# Patient Record
Sex: Male | Born: 1990 | Hispanic: No | Marital: Single | Smoking: Current every day smoker
Health system: Southern US, Community
[De-identification: ages and names within clinical notes are randomized; demographics above are authoritative.]

## PROBLEM LIST (undated history)

## (undated) ENCOUNTER — Emergency Department (HOSPITAL_COMMUNITY): Admission: EM | Discharge status: Absent without leave | Payer: Managed Care, Other (non HMO)

---

## 2011-09-08 ENCOUNTER — Emergency Department (HOSPITAL_COMMUNITY)
Admission: EM | Admit: 2011-09-08 | Discharge: 2011-09-08 | Disposition: A | Discharge status: Home / Self Care | Payer: Managed Care, Other (non HMO) | Attending: Emergency Medicine | Admitting: Emergency Medicine

## 2011-09-08 ENCOUNTER — Encounter (HOSPITAL_COMMUNITY): Payer: Self-pay | Admitting: Emergency Medicine

## 2011-09-08 DIAGNOSIS — R05 Cough: Secondary | ICD-10-CM | POA: Insufficient documentation

## 2011-09-08 DIAGNOSIS — J45909 Unspecified asthma, uncomplicated: Secondary | ICD-10-CM | POA: Insufficient documentation

## 2011-09-08 DIAGNOSIS — R059 Cough, unspecified: Secondary | ICD-10-CM | POA: Insufficient documentation

## 2011-09-08 DIAGNOSIS — F17200 Nicotine dependence, unspecified, uncomplicated: Secondary | ICD-10-CM | POA: Insufficient documentation

## 2011-09-08 DIAGNOSIS — J069 Acute upper respiratory infection, unspecified: Secondary | ICD-10-CM

## 2011-09-08 MED ORDER — BENZONATATE 100 MG PO CAPS: 100.0000 mg | ORAL_CAPSULE | Freq: Three times a day (TID) | ORAL | Status: AC

## 2011-09-08 NOTE — ED Provider Notes (Signed)
History     CSN: [id number]  Arrival date & time 09/08/11  1623   First MD Initiated Contact with Patient 09/08/11 1828      Chief Complaint  Patient presents with  . URI    (Consider location/radiation/quality/duration/timing/severity/associated sxs/prior treatment) HPI History from patient. 21 year old male who presents with cough, congestion, headache, and dizziness. Symptoms started yesterday, but were worse this morning upon awakening. Cough has been nonproductive in nature. Headache is described as generalized in nature and has not changed since onset. No radiation, no known aggravating/alleviating factors. He also describes a sensation of unsteadiness which was worse when he first awoke this morning. No medication taken at home prior to arrival. No known sick contacts. Denies neck pain, visual changes, photophobia, nausea, vomiting. No change in appetite. No known fever/chills.  Past Medical History  Diagnosis Date  . Asthma     History reviewed. No pertinent past surgical history.  No family history on file.  History  Substance Use Topics  . Smoking status: Current Everyday Smoker  . Smokeless tobacco: Not on file  . Alcohol Use:       Review of Systems as per history of present illness  Allergies  Review of patient's allergies indicates no known allergies.  Home Medications  No current outpatient prescriptions on file.  BP 125/57  Pulse 87  Temp 98.3 F (36.8 C)  Resp 18  SpO2 95%  Physical Exam  Nursing note and vitals reviewed. Constitutional: He appears well-developed and well-nourished. No distress.  HENT:  Head: Normocephalic and atraumatic.  Right Ear: External ear normal.  Left Ear: External ear normal.  Mouth/Throat: Oropharyngeal exudate present.       TMs normal bilaterally. No sinus tenderness to palpation or percussion.  Eyes: Conjunctivae and EOM are normal. Pupils are equal, round, and reactive to light. Right eye exhibits no  discharge. Left eye exhibits no discharge.  Neck: Normal range of motion. Neck supple. Normal range of motion present.  Cardiovascular: Normal rate, regular rhythm and normal heart sounds.   Pulmonary/Chest: Effort normal and breath sounds normal. He exhibits no tenderness.  Abdominal: There is no tenderness.  Musculoskeletal: Normal range of motion.  Lymphadenopathy:    He has no cervical adenopathy.  Neurological: He is alert.  Skin: Skin is warm and dry. He is not diaphoretic.  Psychiatric: He has a normal mood and affect.    ED Course  Procedures (including critical care time)  Labs Reviewed - No data to display No results found.   1. URI (upper respiratory infection)       MDM  This 21 year old male presents with URI like symptoms for the past 2 days. Vital signs stable. He is nontoxic appearing, and has a reassuring exam. Likely URI. Instructed on symptomatic treatment. Reasons to return to ED discussed.         Grant Fontana, [place] 09/08/11 1902

## 2011-09-08 NOTE — Discharge Instructions (Signed)
You appear to have a viral upper respiratory infection at this time. Please take the Tessalon as needed for cough. You can buy Mucinex or Mucinex D over the counter for congestion. You can use ibuprofen (600 mg every 6 hours) for body aches.  Consider using a neti pot, saline nose spray for your chronic congestion which may be coming from allergies. You can also try Zyrtec or Claritin.  Return to the ED if you have a high fever not controlled by medication, neck pain, worsening headache, or any other worrisome symptoms.  RESOURCE GUIDE  Dental Problems  Patients with Medicaid: [facility] [phone]                                           [phone] [employer] Phone:  [phone]                                                  Phone:  [phone]  If unable to pay or uninsured, contact:  [facility] or [facility]. to become qualified for the adult dental clinic.  Chronic Pain Problems Contact [facility]  [phone] Patients need to be referred by their primary care doctor.  Insufficient Money for Medicine Contact [employer]:  call "211" or [facility] 760-248-4147.  No Primary Care Doctor Call [facility]  [phone] Other agencies that provide inexpensive medical care    [facility]  [phone]    [facility]  [phone]    [facility]  [phone]    [facility]  [phone]    [facility]  (580)[phone]    [facility]  [phone]  [facility] Health  [phone] [facility]  [phone] [facility] Mental Health   [phone] (emergency services [phone])  Substance Abuse Resources [facility]  602-184-3201 [facility] [phone] [facility] [phone] Floydene Flock [phone] Residential & Outpatient Substance Abuse Program   [phone]  Abuse/Neglect [facility] Child Abuse Hotline 435-662-2732 [facility] Child Abuse Hotline 667-457-9742 (After Hours)  Emergency Shelter [facility] 732-532-9681  [facility] [place] [facility] (517)171-3813 [facility] (516)[phone]  MRSA Hotline #:   (878)528-0298    [facility] Resources  [facility]     [employer]                          [facility] [address] [place]                       [address]      [address]  Patrecia Pace  Michell Heinrich Phone:  [phone]                                   Phone:  [phone]                 Phone:  [phone]  [facility] Mental Health Phone:  ([phone])[phone]  [facility] Child Abuse Hotline 575-027-9732 (715) [phone] (After Hours)  Upper Respiratory Infection, Adult An upper respiratory infection (URI) is also sometimes known as the common cold. The upper respiratory tract includes the nose, sinuses, throat, trachea, and bronchi. Bronchi are the airways leading to the lungs. Most people improve within 1 week, but symptoms can last up to 2 weeks. A residual cough may last even longer.  CAUSES Many different viruses can infect the tissues lining the upper respiratory tract. The tissues become irritated and inflamed and often become very moist. Mucus production is also common. A cold is contagious. You can easily spread the virus to others by oral contact. This includes kissing, sharing a glass, coughing, or sneezing. Touching your mouth or nose and then touching a surface, which is then touched by another person, can also spread the virus. SYMPTOMS  Symptoms typically develop 1 to 3 days after you come in contact with a cold virus. Symptoms vary from person to person. They may include:  Runny nose.   Sneezing.   Nasal  congestion.   Sinus irritation.   Sore throat.   Loss of voice (laryngitis).   Cough.   Fatigue.   Muscle aches.   Loss of appetite.   Headache.   Low-grade fever.  DIAGNOSIS  You might diagnose your own cold based on familiar symptoms, since most people get a cold 2 to 3 times a year. Your caregiver can confirm this based on your exam. Most importantly, your caregiver can check that your symptoms are not due to another disease such as strep throat, sinusitis, pneumonia, asthma, or epiglottitis. Blood tests, throat tests, and X-rays are not necessary to diagnose a common cold, but they may sometimes be helpful in excluding other more serious diseases. Your caregiver will decide if any further tests are required. RISKS AND COMPLICATIONS  You may be at risk for a more severe case of the common cold if you smoke cigarettes, have chronic heart disease (such as heart failure) or lung disease (such as asthma), or if you have a weakened immune system. The very young and very old are also at risk for more serious infections. Bacterial sinusitis, middle ear infections, and bacterial pneumonia can complicate the common cold. The common cold can worsen asthma and chronic obstructive pulmonary disease (COPD). Sometimes, these complications can require emergency medical care and may be life-threatening. PREVENTION  The best way to protect against getting a cold is to practice good hygiene. Avoid oral or hand contact with people with cold symptoms. Wash your hands often if contact occurs. There is no clear evidence that vitamin C, vitamin E, echinacea, or exercise reduces the chance of developing a cold. However, it is always recommended to get plenty of rest and practice good nutrition. TREATMENT  Treatment is directed at relieving symptoms. There is no cure. Antibiotics are not effective, because the infection is caused by a virus, not by bacteria. Treatment may include:  Increased fluid intake.  Sports drinks offer valuable electrolytes, sugars, and fluids.   Breathing heated mist or steam (vaporizer or shower).  Eating chicken soup or other clear broths, and maintaining good nutrition.   Getting plenty of rest.   Using gargles or lozenges for comfort.   Controlling fevers with ibuprofen or acetaminophen as directed by your caregiver.   Increasing usage of your inhaler if you have asthma.  Zinc gel and zinc lozenges, taken in the first 24 hours of the common cold, can shorten the duration and lessen the severity of symptoms. Pain medicines may help with fever, muscle aches, and throat pain. A variety of non-prescription medicines are available to treat congestion and runny nose. Your caregiver can make recommendations and may suggest nasal or lung inhalers for other symptoms.  HOME CARE INSTRUCTIONS   Only take over-the-counter or prescription medicines for pain, discomfort, or fever as directed by your caregiver.   Use a warm mist humidifier or inhale steam from a shower to increase air moisture. This may keep secretions moist and make it easier to breathe.   Drink enough water and fluids to keep your urine clear or pale yellow.   Rest as needed.   Return to work when your temperature has returned to normal or as your caregiver advises. You may need to stay home longer to avoid infecting others. You can also use a face mask and careful hand washing to prevent spread of the virus.  SEEK MEDICAL CARE IF:   After the first few days, you feel you are getting worse rather than better.   You need your caregiver's advice about medicines to control symptoms.   You develop chills, worsening shortness of breath, or brown or red sputum. These may be signs of pneumonia.   You develop yellow or brown nasal discharge or pain in the face, especially when you bend forward. These may be signs of sinusitis.   You develop a fever, swollen neck glands, pain with swallowing, or white areas  in the back of your throat. These may be signs of strep throat.  SEEK IMMEDIATE MEDICAL CARE IF:   You have a fever.   You develop severe or persistent headache, ear pain, sinus pain, or chest pain.   You develop wheezing, a prolonged cough, cough up blood, or have a change in your usual mucus (if you have chronic lung disease).   You develop sore muscles or a stiff neck.  Document Released: 10/01/2000 Document Revised: 03/27/2011 Document Reviewed: 08/09/2010 [facility] Patient Information 2012 [place], [place].

## 2011-09-09 NOTE — ED Provider Notes (Signed)
Medical screening examination/treatment/procedure(s) were performed by non-physician practitioner and as supervising physician I was immediately available for consultation/collaboration.   Kaula Klenke, MD 09/09/11 0005 

## 2012-02-16 ENCOUNTER — Emergency Department (HOSPITAL_COMMUNITY)
Admission: EM | Admit: 2012-02-16 | Discharge: 2012-02-16 | Disposition: A | Discharge status: Home / Self Care | Payer: Managed Care, Other (non HMO) | Attending: Emergency Medicine | Admitting: Emergency Medicine

## 2012-02-16 ENCOUNTER — Encounter (HOSPITAL_COMMUNITY): Payer: Self-pay | Admitting: Vascular Surgery

## 2012-02-16 DIAGNOSIS — J45909 Unspecified asthma, uncomplicated: Secondary | ICD-10-CM | POA: Insufficient documentation

## 2012-02-16 DIAGNOSIS — L42 Pityriasis rosea: Secondary | ICD-10-CM | POA: Insufficient documentation

## 2012-02-16 DIAGNOSIS — F17200 Nicotine dependence, unspecified, uncomplicated: Secondary | ICD-10-CM | POA: Insufficient documentation

## 2012-02-16 NOTE — ED Provider Notes (Signed)
History  This chart was scribed for [name], MD by Shari Heritage and Marlin Canary. The patient was seen in room TR06C/TR06C. Patient's care was started at 2247.    CSN: [id number]  Arrival date & time 02/16/12  2247   None     Chief Complaint  Patient presents with  . Rash     Patient is a 21 y.o. male presenting with rash. The history is provided by the patient. No language interpreter was used.  Rash  This is a recurrent problem. The current episode started more than 1 week ago. The problem has not changed since onset.There has been no fever. The rash is present on the torso and back. The patient is experiencing no pain. Treatments tried: antibiotics. The treatment provided no relief.    HPI comments:  Luis Farmer is a 21 y.o. male who presents to the Emergency Department complaining of constant, moderate generalized rash to his back onset 2-3 months ago. Patient says he saw an MD in [place] for a rash a few months ago and they thought the rash was due to bacteria. He was prescribed medication but he did not take the full course. Patient also complains of joint paint in his knees and elbows that began 1 month ago. He denies joint swelling, fever, erythema and chills. Patient has a history of asthma. He is an every day smoker.        Associated joint pain Due to bacteria from the water   Past Medical History  Diagnosis Date  . Asthma     History reviewed. No pertinent past surgical history.  History reviewed. No pertinent family history.  History  Substance Use Topics  . Smoking status: Current Every Day Smoker -- 1.0 packs/day    Types: Cigarettes  . Smokeless tobacco: Not on file  . Alcohol Use: Yes     occasionally       Review of Systems  Skin: Positive for rash.  All other systems reviewed and are negative.    Allergies  Review of patient's allergies indicates no known allergies.  Home Medications  No current outpatient prescriptions  on file.  BP 141/79  Pulse 104  Temp 98 F (36.7 C) (Oral)  Resp 18  SpO2 98%  Physical Exam  Nursing note and vitals reviewed. Constitutional: He appears well-developed and well-nourished. No distress.  HENT:  Head: Normocephalic and atraumatic.  Eyes: Conjunctivae normal are normal. Right eye exhibits no discharge. Left eye exhibits no discharge.  Neck: Neck supple.  Cardiovascular: Normal rate, regular rhythm and normal heart sounds.  Exam reveals no gallop and no friction rub.   No murmur heard. Pulmonary/Chest: Effort normal and breath sounds normal. No respiratory distress.  Abdominal: Soft. He exhibits no distension. There is no tenderness.  Musculoskeletal: Normal range of motion. He exhibits no edema and no tenderness.  Neurological: He is alert.  Skin: Skin is warm and dry. Rash noted.       Across back and upper chest there is an ovoid well circumscribed rash. Some lesions have slightly raised borders. Mild scaling in central aspects. Non tender, no drainage.   Psychiatric: He has a normal mood and affect. His behavior is normal. Thought content normal.    ED Course  Procedures (including critical care time) DIAGNOSTIC STUDIES: Oxygen Saturation is 98% on room air. Normal  by my interpretation.    COORDINATION OF CARE: 11:25pm-Patient informed of current plan for treatment and evaluation and agrees with plan at this time.  Labs Reviewed - No data to display No results found.   1. Pityriasis rosea       MDM  21yM with rash. Suspect pityriasis. Expectant management. Not consistent with infectious process. Return precautions discussed.    I personally preformed the services scribed in my presence. The recorded information has been reviewed and considered. [name], MD.        [name], MD 02/18/12 [phone]

## 2012-02-16 NOTE — ED Notes (Signed)
Pt reports to the ED for eval of rash located on his abdomen, right shoulder, and upper back since this summer. Denies any pain or itching. States that he saw an MD in [facility] where he was at the time the rash appeared. He received medication for it but stopped taking it because he reports he doesn't like taking medicine. Also states that his joints feel like they are popping a lot.

## 2012-08-24 ENCOUNTER — Emergency Department (HOSPITAL_COMMUNITY)
Admission: EM | Admit: 2012-08-24 | Discharge: 2012-08-24 | Discharge status: Against Medical Advice | Payer: Managed Care, Other (non HMO) | Attending: Emergency Medicine | Admitting: Emergency Medicine

## 2012-08-24 ENCOUNTER — Emergency Department (HOSPITAL_COMMUNITY): Payer: Managed Care, Other (non HMO)

## 2012-08-24 ENCOUNTER — Encounter (HOSPITAL_COMMUNITY): Payer: Self-pay

## 2012-08-24 DIAGNOSIS — R059 Cough, unspecified: Secondary | ICD-10-CM | POA: Insufficient documentation

## 2012-08-24 DIAGNOSIS — R05 Cough: Secondary | ICD-10-CM | POA: Insufficient documentation

## 2012-08-24 NOTE — ED Notes (Signed)
Pt not in room, unable to locate  

## 2012-08-24 NOTE — ED Notes (Signed)
Unable to locate patient x30 min

## 2012-08-24 NOTE — ED Notes (Signed)
Bed:WA06<BR> Expected date:<BR> Expected time:<BR> Means of arrival:<BR> Comments:<BR>

## 2012-08-24 NOTE — ED Notes (Signed)
Pt states he has been coughing for the past 3 weeks.  Pt states he has been coughing up green mucous.

## 2013-07-19 ENCOUNTER — Emergency Department (HOSPITAL_COMMUNITY)
Admission: EM | Admit: 2013-07-19 | Discharge: 2013-07-19 | Disposition: A | Discharge status: Home / Self Care | Payer: Managed Care, Other (non HMO) | Attending: Emergency Medicine | Admitting: Emergency Medicine

## 2013-07-19 ENCOUNTER — Encounter (HOSPITAL_COMMUNITY): Payer: Self-pay | Admitting: Emergency Medicine

## 2013-07-19 DIAGNOSIS — F17200 Nicotine dependence, unspecified, uncomplicated: Secondary | ICD-10-CM | POA: Insufficient documentation

## 2013-07-19 DIAGNOSIS — J45901 Unspecified asthma with (acute) exacerbation: Secondary | ICD-10-CM | POA: Insufficient documentation

## 2013-07-19 MED ORDER — PREDNISONE 20 MG PO TABS: 10.0000 mg | ORAL_TABLET | Freq: Two times a day (BID) | ORAL | Status: DC

## 2013-07-19 MED ORDER — ALBUTEROL SULFATE (2.5 MG/3ML) 0.083% IN NEBU
5.0000 mg | INHALATION_SOLUTION | Freq: Once | RESPIRATORY_TRACT | Status: AC
  Administered 2013-07-19: 5 mg via RESPIRATORY_TRACT
  Filled 2013-07-19: qty 6

## 2013-07-19 MED ORDER — PREDNISONE 20 MG PO TABS
60.0000 mg | ORAL_TABLET | Freq: Once | ORAL | Status: AC
  Administered 2013-07-19: 60 mg via ORAL
  Filled 2013-07-19: qty 3

## 2013-07-19 MED ORDER — ALBUTEROL SULFATE HFA 108 (90 BASE) MCG/ACT IN AERS: 1.0000 | INHALATION_SPRAY | Freq: Four times a day (QID) | RESPIRATORY_TRACT | Status: DC | PRN

## 2013-07-19 MED ORDER — IPRATROPIUM BROMIDE 0.02 % IN SOLN
0.5000 mg | Freq: Once | RESPIRATORY_TRACT | Status: AC
  Administered 2013-07-19: 0.5 mg via RESPIRATORY_TRACT
  Filled 2013-07-19: qty 2.5

## 2013-07-19 NOTE — ED Notes (Signed)
Pt reports a non productive cough that started 2 days ago and that he feels like something is in his chest. Pt has a congested cough in triage but able to communicate in full sentences. Pt denies any fevers or runny nose. Pt denies talking any medications to help treat symptoms or being around any one sick. Pt report coming back from out of town and the weather was cold. Pt alert and ambulatory.

## 2013-07-19 NOTE — ED Provider Notes (Signed)
Medical screening examination/treatment/procedure(s) were performed by non-physician practitioner and as supervising physician I was immediately available for consultation/collaboration.  Meigan Pates L Katasha Riga, MD 07/19/13 2312 

## 2013-07-19 NOTE — ED Provider Notes (Signed)
CSN: [id number]     Arrival date & time 07/19/13  2030 History  This chart was scribed for non-physician practitioner, Kyung BaccaKatie Jaylyn Iyer, PA-C,working with Flint MelterElliott L Wentz, MD, by Karle PlumberJennifer Tensley, ED Scribe.  This patient was seen in room WTR8/WTR8 and the patient's care was started at 10:15 PM.  Chief Complaint  Patient presents with  . Cough   The history is provided by the patient. No language interpreter was used.   HPI Comments:  Isidor HoltsMajed Audia is a 23 y.o. male with h/o asthma, who presents to the Emergency Department complaining of a nonproductive cough that started about four days ago. He reports traveling to [place] and the weather was really cold there. He reports associated nasal congestion and sneezing. He denies any sick contacts. He denies SOB, trouble breathing, nausea, fever, or abdominal pain. He states his last asthma exacerbation was "a long time ago". He denies allergies to any medications.   Past Medical History  Diagnosis Date  . Asthma    History reviewed. No pertinent past surgical history. History reviewed. No pertinent family history. History  Substance Use Topics  . Smoking status: Current Every Day Smoker -- 1.00 packs/day    Types: Cigarettes  . Smokeless tobacco: Not on file  . Alcohol Use: Yes     Comment: occasionally     Review of Systems  Constitutional: Negative for fever.  Respiratory: Positive for cough.   All other systems reviewed and are negative.    Allergies  Review of patient's allergies indicates no known allergies.  Home Medications  No current outpatient prescriptions on file. Triage Vitals: BP 131/64  Pulse 106  Temp(Src) 98.4 F (36.9 C) (Oral)  Resp 20  SpO2 96% Physical Exam  Nursing note and vitals reviewed. Constitutional: He is oriented to person, place, and time. He appears well-developed and well-nourished.  HENT:  Head: Normocephalic and atraumatic.  Eyes: Conjunctivae are normal.  Neck: Normal range of  motion.  Cardiovascular: Normal rate.   Pulmonary/Chest: Effort normal. He exhibits no tenderness.  Coughing.  Mild, diffuse expiratory wheezing.    Musculoskeletal: Normal range of motion.  Neurological: He is alert and oriented to person, place, and time.  Skin: Skin is warm and dry.  Psychiatric: He has a normal mood and affect. His behavior is normal.    ED Course  Procedures (including critical care time) DIAGNOSTIC STUDIES: Oxygen Saturation is 96% on RA, adequate by my interpretation.   COORDINATION OF CARE: 10:19 PM- Will order a nebulizer treatment. Pt verbalizes understanding and agrees to plan.  Medications - No data to display  Labs Review Labs Reviewed - No data to display Imaging Review No results found.   EKG Interpretation None      MDM   Final diagnoses:  Asthma exacerbation    23yo M w/ h/o asthma presents w/ cough, wheezing, sensation of something being stuck in center of chest.  Asthma exacerbations infrequent and he is not sure if that is what he is experiencing.  Attributes sx to change in climate and being exposed to strong cologne on trip to [place] this past weekend.  On exam, afebrile, VS w/in nml range, no respiratory distress, diffuse expiratory wheezing, coughing.  Suspect asthma attack.  Pt to receive albuterol/atrovent neb and prednisone.  Will reassess shortly.  10:36 PM   Pt reports feeling better and wheezing resolved on re-examination.  Prescribed albuterol inhaler and 5d course of prednisone.  Provided him w/ phone number for [facility].  Return precautions  discussed.   I personally performed the services described in this documentation, which was scribed in my presence. The recorded information has been reviewed and is accurate.    Otilio Miu, PA-C 07/19/13 2308

## 2013-07-19 NOTE — ED Notes (Signed)
Pt left without getting his discharge instructions.

## 2013-07-19 NOTE — Discharge Instructions (Signed)
Use albuterol inhaler, 2 puffs every 4 hours, as needed for cough and shortness of breath.  Take prednisone as prescribed.   Return to the ER if your symptoms worsen.  Asthma, Acute Bronchospasm Acute bronchospasm caused by asthma is also referred to as an asthma attack. Bronchospasm means your air passages become narrowed. The narrowing is caused by inflammation and tightening of the muscles in the air tubes (bronchi) in your lungs. This can make it hard to breath or cause you to wheeze and cough. CAUSES Possible triggers are:  Animal dander from the skin, hair, or feathers of animals.  Dust mites contained in house dust.  Cockroaches.  Pollen from trees or grass.  Mold.  Cigarette or tobacco smoke.  Air pollutants such as dust, household cleaners, hair sprays, aerosol sprays, paint fumes, strong chemicals, or strong odors.  Cold air or weather changes. Cold air may trigger inflammation. Winds increase molds and pollens in the air.  Strong emotions such as crying or laughing hard.  Stress.  Certain medicines such as aspirin or beta-blockers.  Sulfites in foods and drinks, such as dried fruits and wine.  Infections or inflammatory conditions, such as a flu, cold, or inflammation of the nasal membranes (rhinitis).  Gastroesophageal reflux disease (GERD). GERD is a condition where stomach acid backs up into your throat (esophagus).  Exercise or strenuous activity. SIGNS AND SYMPTOMS   Wheezing.  Excessive coughing, particularly at night.  Chest tightness.  Shortness of breath. DIAGNOSIS  Your health care provider will ask you about your medical history and perform a physical exam. A chest X-ray or blood testing may be performed to look for other causes of your symptoms or other conditions that may have triggered your asthma attack. TREATMENT  Treatment is aimed at reducing inflammation and opening up the airways in your lungs. Most asthma attacks are treated with  inhaled medicines. These include quick relief or rescue medicines (such as bronchodilators) and controller medicines (such as inhaled corticosteroids). These medicines are sometimes given through an inhaler or a nebulizer. Systemic steroid medicine taken by mouth or given through an IV tube also can be used to reduce the inflammation when an attack is moderate or severe. Antibiotic medicines are only used if a bacterial infection is present.  HOME CARE INSTRUCTIONS   Rest.  Drink plenty of liquids. This helps the mucus to remain thin and be easily coughed up. Only use caffeine in moderation and do not use alcohol until you have recovered from your illness.  Do not smoke. Avoid being exposed to secondhand smoke.  You play a critical role in keeping yourself in good health. Avoid exposure to things that cause you to wheeze or to have breathing problems.  Keep your medicines up to date and available. Carefully follow your health care provider's treatment plan.  Take your medicine exactly as prescribed.  When pollen or pollution is bad, keep windows closed and use an air conditioner or go to places with air conditioning.  Asthma requires careful medical care. See your health care provider for a follow-up as advised. If you are more than [redacted] weeks pregnant and you were prescribed any new medicines, let your obstetrician know about the visit and how you are doing. Follow-up with your health care provider as directed.  After you have recovered from your asthma attack, make an appointment with your outpatient doctor to talk about ways to reduce the likelihood of future attacks. If you do not have a doctor who manages your  asthma, make an appointment with a primary care doctor to discuss your asthma. SEEK IMMEDIATE MEDICAL CARE IF:   You are getting worse.  You have trouble breathing. If severe, call your local emergency services (911 in the [place]  You develop chest pain or discomfort.  You are  vomiting.  You are not able to keep fluids down.  You are coughing up yellow, green, brown, or bloody sputum.  You have a fever and your symptoms suddenly get worse.  You have trouble swallowing. MAKE SURE YOU:   Understand these instructions.  Will watch your condition.  Will get help right away if you are not doing well or get worse. Document Released: 07/23/2006 Document Revised: 12/08/2012 Document Reviewed: 10/13/2012 [facility] Patient Information 2014 [place], [place].

## 2014-08-24 IMAGING — CR DG CHEST 2V
2 series · 2 of 2 positions shown · non-contrast
Comparison: None.

CLINICAL DATA: Nonproductive cough

CHEST - 2 VIEW

[w chest pa]
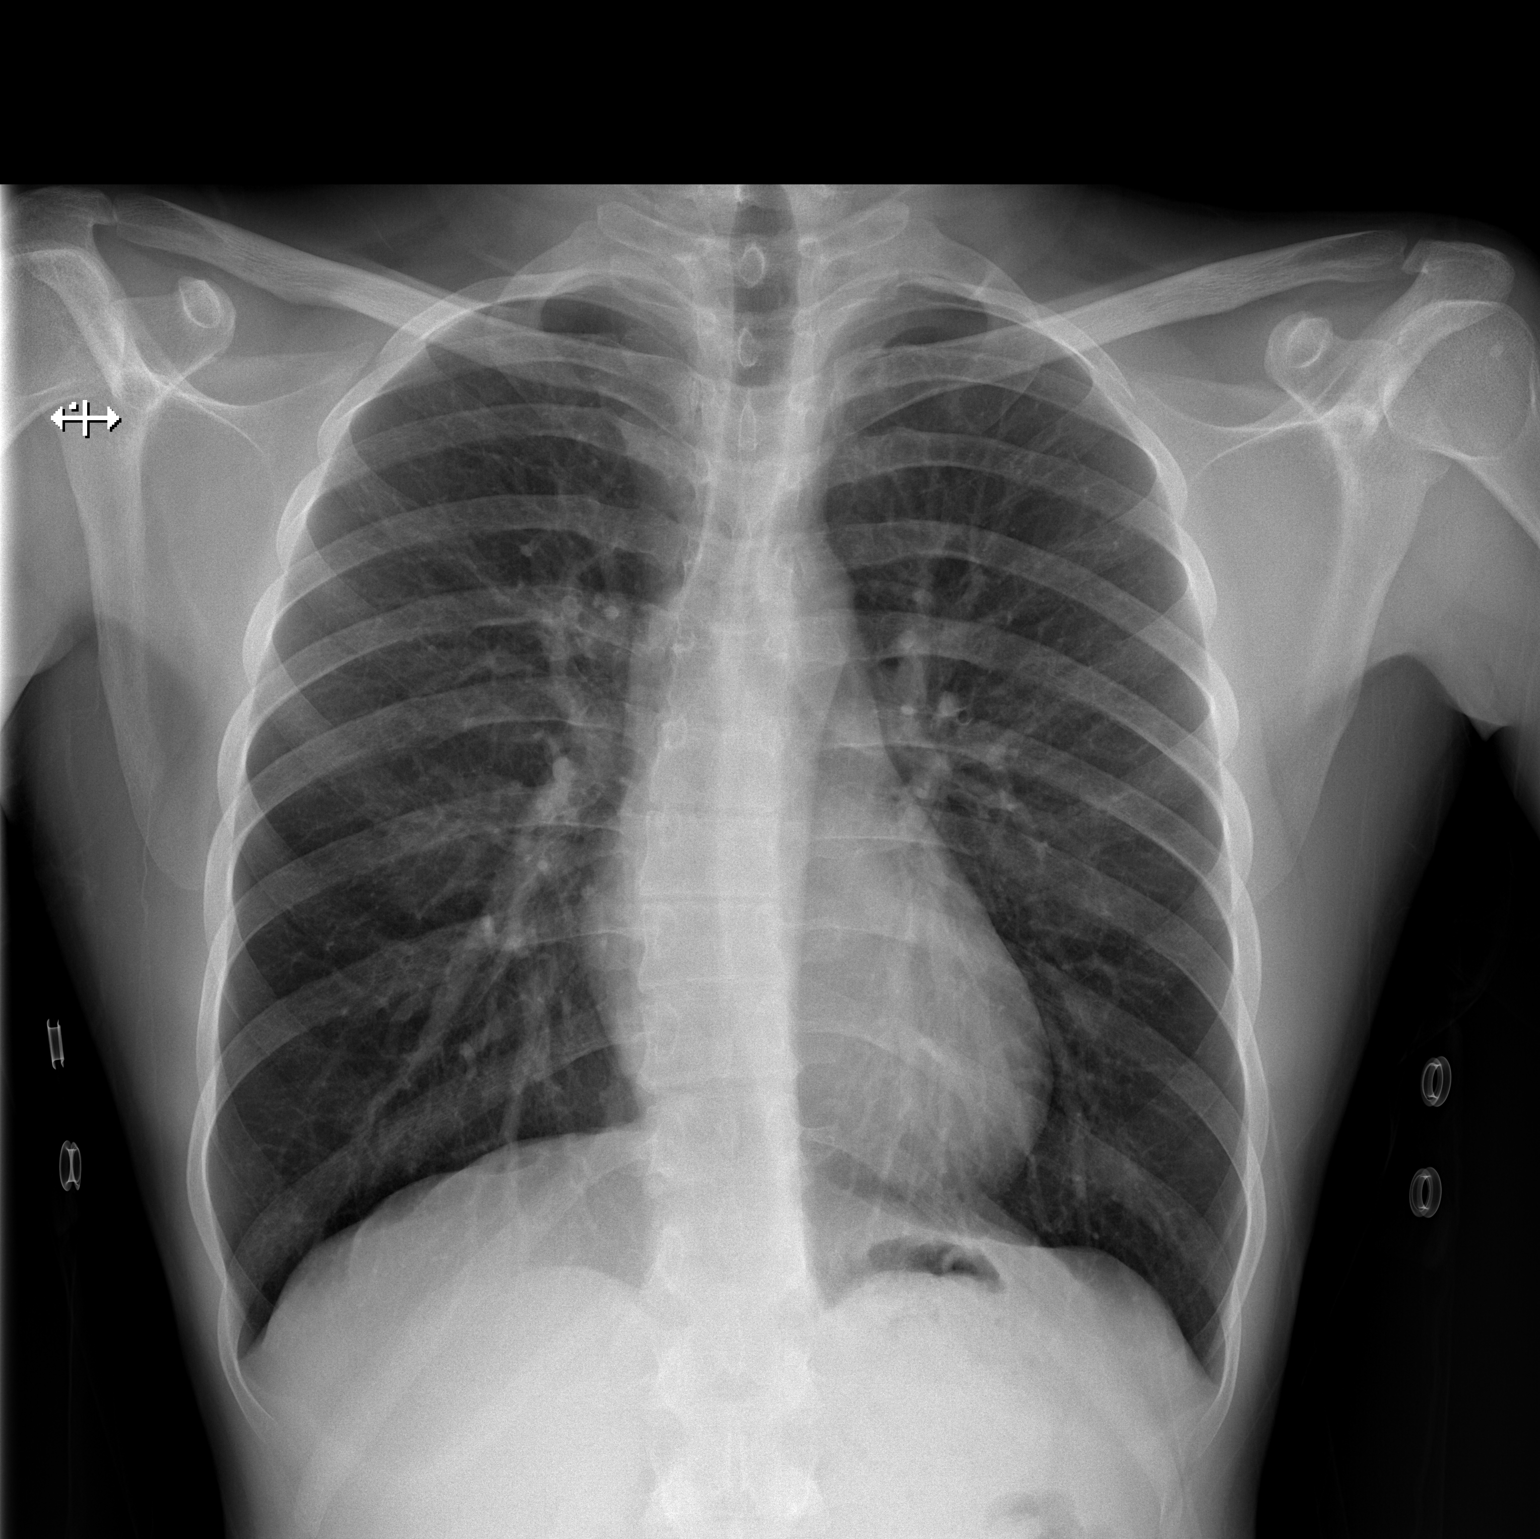

[w chest lat]
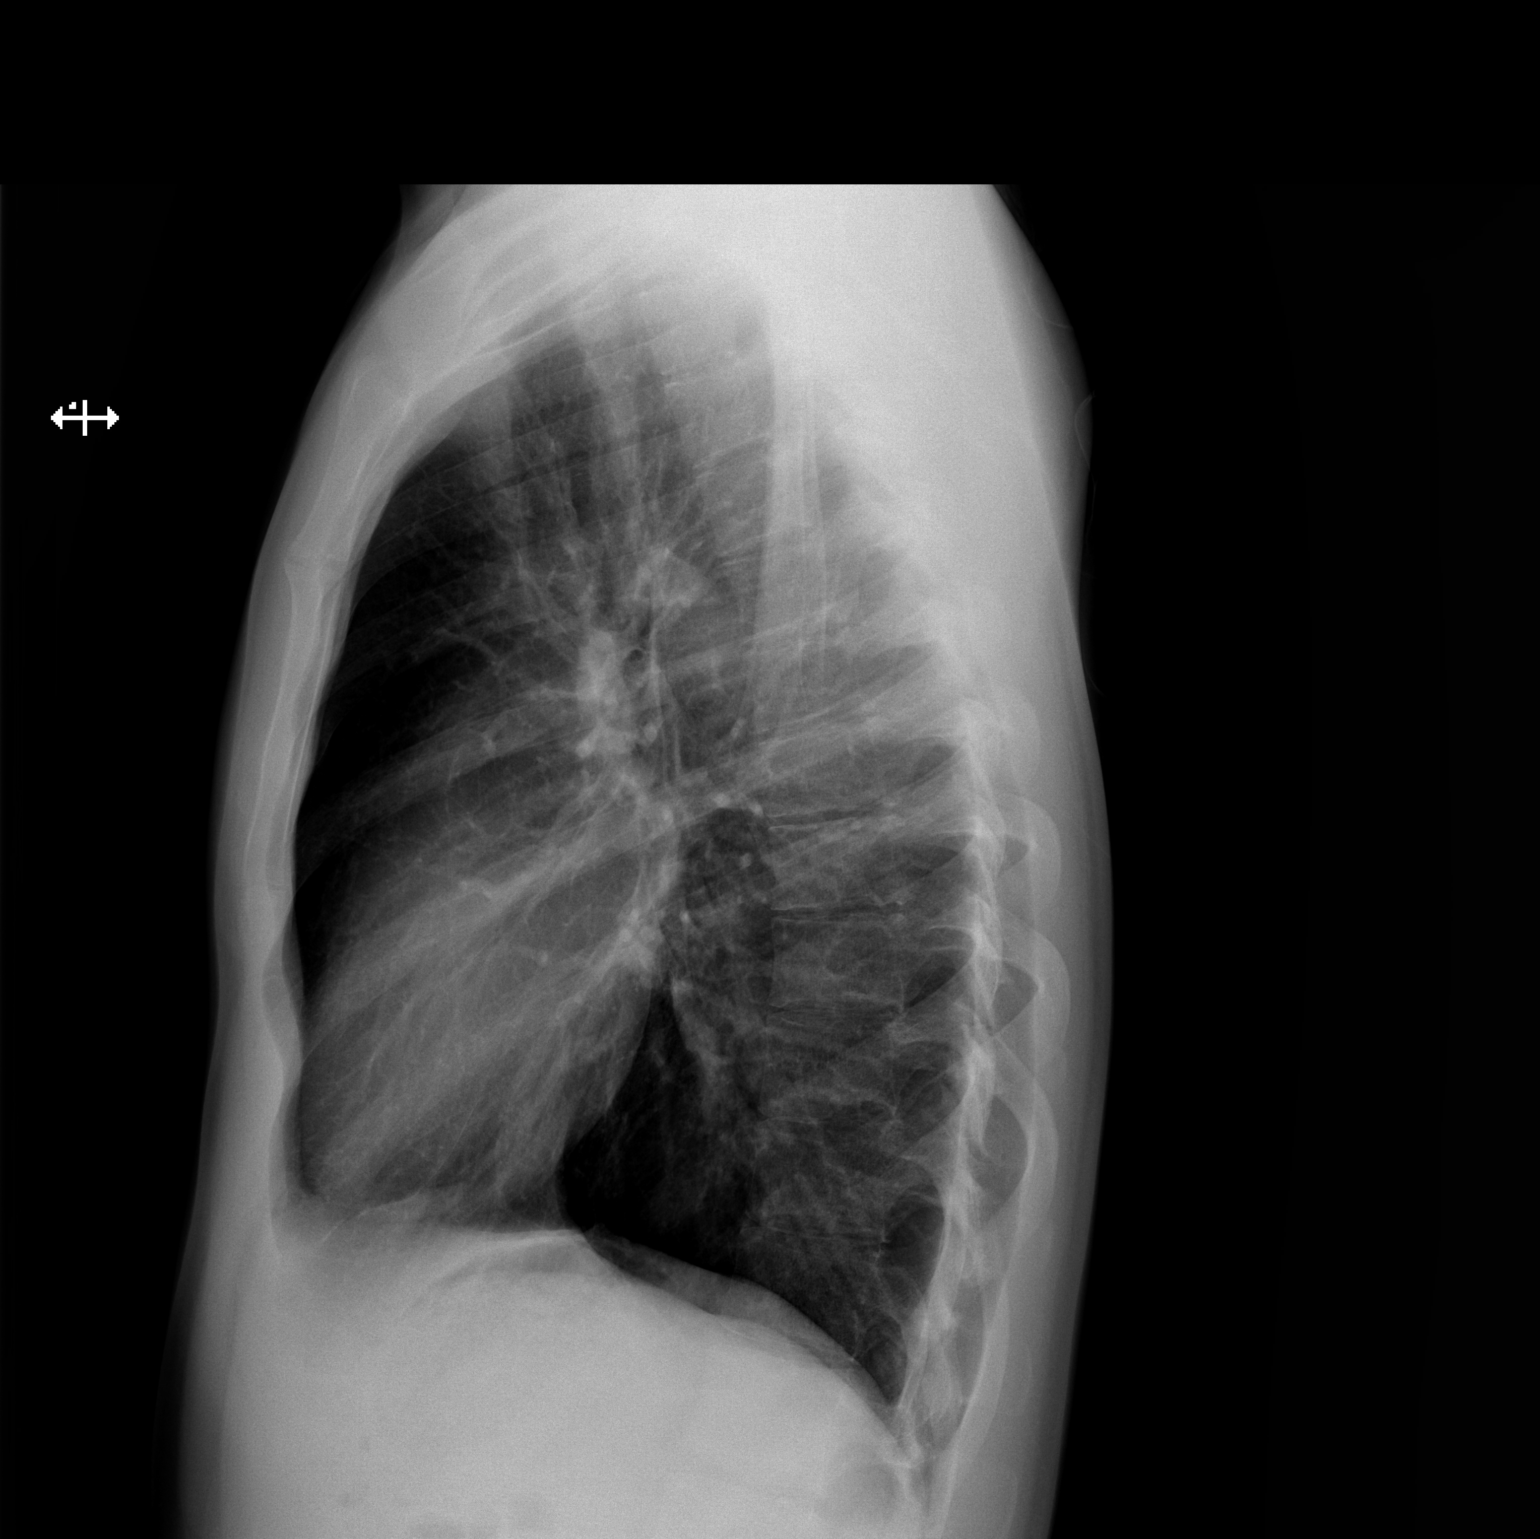

[2 of 2 positions shown; findings below may reference images not displayed]

FINDINGS: Normal mediastinum and cardiac silhouette.  Normal
pulmonary  vasculature.  No evidence of effusion, infiltrate, or
pneumothorax.  No acute bony abnormality.
IMPRESSION: No acute cardiopulmonary process.

## 2015-02-28 ENCOUNTER — Emergency Department (HOSPITAL_COMMUNITY)
Admission: EM | Admit: 2015-02-28 | Discharge: 2015-02-28 | Disposition: A | Discharge status: Home / Self Care | Payer: Managed Care, Other (non HMO) | Attending: Emergency Medicine | Admitting: Emergency Medicine

## 2015-02-28 ENCOUNTER — Encounter (HOSPITAL_COMMUNITY): Payer: Self-pay | Admitting: Emergency Medicine

## 2015-02-28 DIAGNOSIS — R1013 Epigastric pain: Secondary | ICD-10-CM

## 2015-02-28 DIAGNOSIS — R112 Nausea with vomiting, unspecified: Secondary | ICD-10-CM

## 2015-02-28 DIAGNOSIS — J45909 Unspecified asthma, uncomplicated: Secondary | ICD-10-CM | POA: Insufficient documentation

## 2015-02-28 DIAGNOSIS — Z720 Tobacco use: Secondary | ICD-10-CM | POA: Insufficient documentation

## 2015-02-28 LAB — COMPREHENSIVE METABOLIC PANEL
ALBUMIN: 5 g/dL (ref 3.5–5.0)
ALT: 21 U/L (ref 17–63)
AST: 22 U/L (ref 15–41)
Alkaline Phosphatase: 59 U/L (ref 38–126)
Anion gap: 8 (ref 5–15)
BUN: 20 mg/dL (ref 6–20)
CHLORIDE: 105 mmol/L (ref 101–111)
CO2: 27 mmol/L (ref 22–32)
Calcium: 9.7 mg/dL (ref 8.9–10.3)
Creatinine, Ser: 0.8 mg/dL (ref 0.61–1.24)
GFR calc Af Amer: >60 mL/min (ref >60)
GFR calc non Af Amer: >60 mL/min (ref >60)
GLUCOSE: 108 mg/dL — AB (ref 65–99)
POTASSIUM: 4.5 mmol/L (ref 3.5–5.1)
Sodium: 140 mmol/L (ref 135–145)
Total Bilirubin: 1 mg/dL (ref 0.3–1.2)
Total Protein: 7.8 g/dL (ref 6.5–8.1)

## 2015-02-28 LAB — URINALYSIS, ROUTINE W REFLEX MICROSCOPIC
BILIRUBIN URINE: NEGATIVE
GLUCOSE, UA: NEGATIVE mg/dL
HGB URINE DIPSTICK: NEGATIVE
Ketones, ur: 40 mg/dL — AB
Leukocytes, UA: NEGATIVE
Nitrite: NEGATIVE
PH: 7 (ref 5.0–8.0)
Protein, ur: NEGATIVE mg/dL
SPECIFIC GRAVITY, URINE: 1.03 (ref 1.005–1.030)
Urobilinogen, UA: 0.2 mg/dL (ref 0.0–1.0)

## 2015-02-28 LAB — CBC
HEMATOCRIT: 45.6 % (ref 39.0–52.0)
Hemoglobin: 16.5 g/dL (ref 13.0–17.0)
MCH: 31.3 pg (ref 26.0–34.0)
MCHC: 36.2 g/dL — AB (ref 30.0–36.0)
MCV: 86.4 fL (ref 78.0–100.0)
Platelets: 235 10*3/uL (ref 150–400)
RBC: 5.28 MIL/uL (ref 4.22–5.81)
RDW: 12.3 % (ref 11.5–15.5)
WBC: 9.3 10*3/uL (ref 4.0–10.5)

## 2015-02-28 LAB — LIPASE, BLOOD: LIPASE: 30 U/L (ref 11–51)

## 2015-02-28 MED ORDER — ONDANSETRON HCL 4 MG PO TABS: 4.0000 mg | ORAL_TABLET | Freq: Three times a day (TID) | ORAL | Status: AC | PRN

## 2015-02-28 NOTE — ED Notes (Signed)
Pt states that he started having abdominal pain last night during the election with N/V. Denies diarrhea. Alert and oriented.

## 2015-02-28 NOTE — ED Provider Notes (Signed)
CSN: [id number]     Arrival date & time 02/28/15  1712 History   First MD Initiated Contact with Patient 02/28/15 2039     Chief Complaint  Patient presents with  . Abdominal Pain     (Consider location/radiation/quality/duration/timing/severity/associated sxs/prior Treatment) HPI   Pt presents with upper abdominal pain, N/V that began this morning around 5am, last episode was approximately 1-2 hours prior to coming to ED.  States the epigastric pain felt like stretching or pulling of the upper abdomen and would last about 2 minutes, was followed by vomiting which caused the pain to resolve.  Emesis was contents of stomach and then saliva.  Had normal bowel movement at 4pm.  Pt was awake all night watching the election returns, was drinking a lot of coffee.  Denies drinking ETOH or any abnormal foods.  Denies fevers, CP, SOB.  He is currently feeling much better and is actually very hungry.    Past Medical History  Diagnosis Date  . Asthma    History reviewed. No pertinent past surgical history. History reviewed. No pertinent family history. Social History  Substance Use Topics  . Smoking status: Current Every Day Smoker -- 1.00 packs/day    Types: Cigarettes  . Smokeless tobacco: None  . Alcohol Use: Yes     Comment: occasionally     Review of Systems  All other systems reviewed and are negative.     Allergies  Review of patient's allergies indicates no known allergies.  Home Medications   Prior to Admission medications   Medication Sig Start Date End Date Taking? Authorizing Provider  ondansetron (ZOFRAN) 4 MG tablet Take 1 tablet (4 mg total) by mouth every 8 (eight) hours as needed for nausea or vomiting. 02/28/15   Trixie Dredge, PA-C   BP 131/47 mmHg  Pulse 89  Temp(Src) 97.5 F (36.4 C) (Oral)  Resp 18  SpO2 100% Physical Exam  Constitutional: He appears well-developed and well-nourished. No distress.  HENT:  Head: Normocephalic and atraumatic.  Neck: Neck  supple.  Cardiovascular: Normal rate and regular rhythm.   Pulmonary/Chest: Effort normal and breath sounds normal. No respiratory distress. He has no wheezes. He has no rales.  Abdominal: Soft. He exhibits no distension and no mass. There is no tenderness. There is no rebound and no guarding.  Neurological: He is alert. He exhibits normal muscle tone.  Skin: He is not diaphoretic.  Nursing note and vitals reviewed.   ED Course  Procedures (including critical care time) Labs Review Labs Reviewed  COMPREHENSIVE METABOLIC PANEL - Abnormal; Notable for the following:    Glucose, Bld 108 (*)    All other components within normal limits  CBC - Abnormal; Notable for the following:    MCHC 36.2 (*)    All other components within normal limits  URINALYSIS, ROUTINE W REFLEX MICROSCOPIC (NOT AT [facility]) - Abnormal; Notable for the following:    Ketones, ur 40 (*)    All other components within normal limits  LIPASE, BLOOD    Imaging Review No results found. I have personally reviewed and evaluated these images and lab results as part of my medical decision-making.   EKG Interpretation None      MDM   Final diagnoses:  Non-intractable vomiting with nausea, vomiting of unspecified type  Epigastric pain   Afebrile, nontoxic patient with resolved epigastric pain that occurred just before N/V.  Nonbloody, nonbilious emesis.  Abdomen completely nontender.  Pt feeling better.  Likely due to excessive coffee  drinking, staying awake all night, stressful election results.   D/C home with zofran PRN.   Discussed result, findings, treatment, and follow up  with patient.  Pt given return precautions.  Pt verbalizes understanding and agrees with plan.          Trixie Dredgemily Christophe Rising, PA-C 02/28/15 2148  Leta BaptistEmily Roe Nguyen, MD 03/01/15 [id number]

## 2015-02-28 NOTE — ED Notes (Signed)
Pt tolerated Sprite and Crackers without vomiting

## 2015-02-28 NOTE — Discharge Instructions (Signed)
Read the information below.  Use the prescribed medication as directed.  Please discuss all new medications with your pharmacist.  You may return to the Emergency Department at any time for worsening condition or any new symptoms that concern you.    If you develop high fevers, worsening abdominal pain, uncontrolled vomiting, or are unable to tolerate fluids by mouth, return to the ER for a recheck.     Nausea and Vomiting Nausea is a sick feeling that often comes before throwing up (vomiting). Vomiting is a reflex where stomach contents come out of your mouth. Vomiting can cause severe loss of body fluids (dehydration). Children and elderly adults can become dehydrated quickly, especially if they also have diarrhea. Nausea and vomiting are symptoms of a condition or disease. It is important to find the cause of your symptoms. CAUSES   Direct irritation of the stomach lining. This irritation can result from increased acid production (gastroesophageal reflux disease), infection, food poisoning, taking certain medicines (such as nonsteroidal anti-inflammatory drugs), alcohol use, or tobacco use.  Signals from the brain.These signals could be caused by a headache, heat exposure, an inner ear disturbance, increased pressure in the brain from injury, infection, a tumor, or a concussion, pain, emotional stimulus, or metabolic problems.  An obstruction in the gastrointestinal tract (bowel obstruction).  Illnesses such as diabetes, hepatitis, gallbladder problems, appendicitis, kidney problems, cancer, sepsis, atypical symptoms of a heart attack, or eating disorders.  Medical treatments such as chemotherapy and radiation.  Receiving medicine that makes you sleep (general anesthetic) during surgery. DIAGNOSIS Your caregiver may ask for tests to be done if the problems do not improve after a few days. Tests may also be done if symptoms are severe or if the reason for the nausea and vomiting is not clear.  Tests may include:  Urine tests.  Blood tests.  Stool tests.  Cultures (to look for evidence of infection).  X-rays or other imaging studies. Test results can help your caregiver make decisions about treatment or the need for additional tests. TREATMENT You need to stay well hydrated. Drink frequently but in small amounts.You may wish to drink water, sports drinks, clear broth, or eat frozen ice pops or gelatin dessert to help stay hydrated.When you eat, eating slowly may help prevent nausea.There are also some antinausea medicines that may help prevent nausea. HOME CARE INSTRUCTIONS   Take all medicine as directed by your caregiver.  If you do not have an appetite, do not force yourself to eat. However, you must continue to drink fluids.  If you have an appetite, eat a normal diet unless your caregiver tells you differently.  Eat a variety of complex carbohydrates (rice, wheat, potatoes, bread), lean meats, yogurt, fruits, and vegetables.  Avoid high-fat foods because they are more difficult to digest.  Drink enough water and fluids to keep your urine clear or pale yellow.  If you are dehydrated, ask your caregiver for specific rehydration instructions. Signs of dehydration may include:  Severe thirst.  Dry lips and mouth.  Dizziness.  Dark urine.  Decreasing urine frequency and amount.  Confusion.  Rapid breathing or pulse. SEEK IMMEDIATE MEDICAL CARE IF:   You have blood or brown flecks (like coffee grounds) in your vomit.  You have black or bloody stools.  You have a severe headache or stiff neck.  You are confused.  You have severe abdominal pain.  You have chest pain or trouble breathing.  You do not urinate at least once every 8  hours.  You develop cold or clammy skin.  You continue to vomit for longer than 24 to 48 hours.  You have a fever. MAKE SURE YOU:   Understand these instructions.  Will watch your condition.  Will get help right  away if you are not doing well or get worse.   This information is not intended to replace advice given to you by your health care provider. Make sure you discuss any questions you have with your health care provider.   Document Released: 04/07/2005 Document Revised: 06/30/2011 Document Reviewed: 09/04/2010 Elsevier Interactive Patient Education 2016 [employer]  Abdominal Pain, Adult Many things can cause abdominal pain. Usually, abdominal pain is not caused by a disease and will improve without treatment. It can often be observed and treated at home. Your health care provider will do a physical exam and possibly order blood tests and X-rays to help determine the seriousness of your pain. However, in many cases, more time must pass before a clear cause of the pain can be found. Before that point, your health care provider may not know if you need more testing or further treatment. HOME CARE INSTRUCTIONS Monitor your abdominal pain for any changes. The following actions may help to alleviate any discomfort you are experiencing:  Only take over-the-counter or prescription medicines as directed by your health care provider.  Do not take laxatives unless directed to do so by your health care provider.  Try a clear liquid diet (broth, tea, or water) as directed by your health care provider. Slowly move to a bland diet as tolerated. SEEK MEDICAL CARE IF:  You have unexplained abdominal pain.  You have abdominal pain associated with nausea or diarrhea.  You have pain when you urinate or have a bowel movement.  You experience abdominal pain that wakes you in the night.  You have abdominal pain that is worsened or improved by eating food.  You have abdominal pain that is worsened with eating fatty foods.  You have a fever. SEEK IMMEDIATE MEDICAL CARE IF:  Your pain does not go away within 2 hours.  You keep throwing up (vomiting).  Your pain is felt only in portions of the abdomen,  such as the right side or the left lower portion of the abdomen.  You pass bloody or black tarry stools. MAKE SURE YOU:  Understand these instructions.  Will watch your condition.  Will get help right away if you are not doing well or get worse.   This information is not intended to replace advice given to you by your health care provider. Make sure you discuss any questions you have with your health care provider.   Document Released: 01/15/2005 Document Revised: 12/27/2014 Document Reviewed: 12/15/2012 Elsevier Interactive Patient Education [employer]
